# Patient Record
Sex: Female | Born: 2005 | Hispanic: Yes | Marital: Single | Smoking: Never smoker
Health system: Southern US, Community
[De-identification: ages and names within clinical notes are randomized; demographics above are authoritative.]

## PROBLEM LIST (undated history)

## (undated) DIAGNOSIS — Z789 Other specified health status: Secondary | ICD-10-CM

## (undated) DIAGNOSIS — H539 Unspecified visual disturbance: Secondary | ICD-10-CM

## (undated) DIAGNOSIS — F419 Anxiety disorder, unspecified: Secondary | ICD-10-CM

## (undated) HISTORY — DX: Unspecified visual disturbance: H53.9

## (undated) HISTORY — DX: Anxiety disorder, unspecified: F41.9

## (undated) HISTORY — PX: OTHER SURGICAL HISTORY: SHX169

## (undated) HISTORY — PX: WISDOM TOOTH EXTRACTION: SHX21

---
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
## 2024-05-21 ENCOUNTER — Telehealth: Payer: Self-pay | Admitting: *Deleted

## 2024-05-21 NOTE — Telephone Encounter (Signed)
 Called and left a voicemail for patient to call the office to reschedule the appointment for 06/05/24 due to the provider being out of the office.

## 2024-05-28 ENCOUNTER — Ambulatory Visit

## 2024-06-05 ENCOUNTER — Ambulatory Visit: Admitting: Family Medicine

## 2024-06-17 ENCOUNTER — Telehealth: Payer: Self-pay | Admitting: Family Medicine

## 2024-06-17 NOTE — Telephone Encounter (Signed)
 I called the patient and left a message stating the following:   Our office has you scheduled for a new patient appointment on 06/26/24, with an arrival time of 2:20 PM. Please remember to bring your insurance card, photo ID, and a medication list of anything you're currently taking at this time. Should you need to cancel the appointment, we do require a 24-hour notice. If the appointment is canceled the day of or no showed, then we will not be able to reschedule your new patient appointment.  Please call the office back at [phone] to confirm your appointment and to go over any further registration questions we may have. Please ask to speak with [place].   Thank you for your time and we look forward to speaking with you.

## 2024-06-25 ENCOUNTER — Ambulatory Visit

## 2024-06-25 VITALS — BP 104/70 | HR 98 | Temp 97.5°F | Ht 62.0 in | Wt 202.8 lb

## 2024-06-25 DIAGNOSIS — E559 Vitamin D deficiency, unspecified: Secondary | ICD-10-CM | POA: Insufficient documentation

## 2024-06-25 DIAGNOSIS — L219 Seborrheic dermatitis, unspecified: Secondary | ICD-10-CM | POA: Insufficient documentation

## 2024-06-25 DIAGNOSIS — R21 Rash and other nonspecific skin eruption: Secondary | ICD-10-CM | POA: Insufficient documentation

## 2024-06-25 DIAGNOSIS — N920 Excessive and frequent menstruation with regular cycle: Secondary | ICD-10-CM | POA: Insufficient documentation

## 2024-06-25 MED ORDER — NORGESTIMATE-ETH ESTRADIOL 0.18/0.215/0.25 MG-25 MCG PO TABS: 1.0000 | ORAL_TABLET | Freq: Every day | ORAL | 3 refills | Status: AC

## 2024-06-25 MED ORDER — VITAMIN D (ERGOCALCIFEROL) 1.25 MG (50000 UNIT) PO CAPS: 50000.0000 [IU] | ORAL_CAPSULE | ORAL | 0 refills | Status: AC

## 2024-06-25 NOTE — Progress Notes (Unsigned)
 Subjective:  Patient ID: Terri [name], female    DOB: 2006/05/07  Age: 18 y.o. MRN: [id number]  Chief Complaint  Patient presents with   Establish Care    HPI:  Patient is establishing as a new patient.  Discussed the use of AI scribe software for clinical note transcription with the patient, who gave verbal consent to proceed.  Discussed the use of AI scribe software for clinical note transcription with the patient, who gave verbal consent to proceed.  History of Present Illness   Terri [name] is a 18 year old female who presents with a rash on her legs and concerns about menstrual irregularities.  Lower extremity rash - Rash began as a few bumps on the legs approximately five to six months ago - Initially thought to be related to shaving - Rash has spread to both legs and thighs - Occasionally pruritic - Treated at urgent care for presumed infection due to scratching - Currently using adapalene without improvement  Menstrual irregularities - Menstrual cycles previously regular every 25 days since age 50 - Last month, experienced two cycles within a month, with periods occurring every two weeks for about a month and a half - No significant stress or changes in routine - Not on prescribed birth control - Uses an over-the-counter pill called 'Opium '  Hair loss and hirsutism - Hair loss on scalp - Facial hair growth present  Vitamin d  deficiency - Low vitamin D  levels identified earlier this year - Not on prescribed vitamin D  supplementation - Uses over-the-counter vitamin D   Visual disturbances and history of optic neuritis - History of optic neuritis diagnosed at age 26, treated with high-dose steroids - Near-complete recovery with some residual vision effects - No current vision problems aside from residual effects from past optic neuritis             06/25/2024    1:56 PM  Depression screen PHQ 2/9  Decreased Interest 0  Down,  Depressed, Hopeless 0  PHQ - 2 Score 0         06/25/2024    1:56 PM  Fall Risk  Falls in the past year? 0  Was there an injury with Fall? 0  Fall Risk Category Calculator 0  Patient at Risk for Falls Due to No Fall Risks  Fall risk Follow up Falls evaluation completed     Medications Ordered Prior to Encounter[1]. Social History   Socioeconomic History   Marital status: Single    Spouse name: Not on file   Number of children: 0   Years of education: Not on file   Highest education level: Some college, no degree  Occupational History   Not on file  Tobacco Use   Smoking status: Never   Smokeless tobacco: Never  Vaping Use   Vaping status: Never Used  Substance and Sexual Activity   Alcohol use: Never   Drug use: Never   Sexual activity: Yes    Partners: Male  Other Topics Concern   Not on file  Social History Narrative   Not on file   Social Drivers of Health   Tobacco Use: Low Risk (06/25/2024)   Patient History    Smoking Tobacco Use: Never    Smokeless Tobacco Use: Never    Passive Exposure: Not on file  Financial Resource Strain: Low Risk (06/24/2024)   Overall Financial Resource Strain (CARDIA)    Difficulty of Paying Living Expenses: Not very hard  Food Insecurity: No Food Insecurity (  06/24/2024)   Epic    Worried About Programme Researcher, Broadcasting/film/video in the Last Year: Never true    [employer] of Food in the Last Year: Never true  Transportation Needs: No Transportation Needs (06/24/2024)   Epic    Lack of Transportation (Medical): No    Lack of Transportation (Non-Medical): No  Physical Activity: Insufficiently Active (06/24/2024)   Exercise Vital Sign    Days of Exercise per Week: 2 days    Minutes of Exercise per Session: 30 min  Stress: No Stress Concern Present (06/24/2024)   [employer] of Occupational Health - Occupational Stress Questionnaire    Feeling of Stress: Only a little  Social Connections: Moderately Integrated  (06/24/2024)   Social Connection and Isolation Panel    Frequency of Communication with Friends and Family: More than three times a week    Frequency of Social Gatherings with Friends and Family: Once a week    Attends Religious Services: More than 4 times per year    Active Member of Clubs or Organizations: Yes    Attends Banker Meetings: More than 4 times per year    Marital Status: Never married  Depression (PHQ2-9): Low Risk (06/25/2024)   Depression (PHQ2-9)    PHQ-2 Score: 0  Alcohol Screen: Low Risk (06/25/2024)   Alcohol Screen    Last Alcohol Screening Score (AUDIT): 0  Housing: Low Risk (06/24/2024)   Epic    Unable to Pay for Housing in the Last Year: No    Number of Times Moved in the Last Year: 0    Homeless in the Last Year: No  Utilities: Not At Risk (06/25/2024)   Epic    Threatened with loss of utilities: No  Health Literacy: Adequate Health Literacy (06/25/2024)   B1300 Health Literacy    Frequency of need for help with medical instructions: Never   Past Medical History:  Diagnosis Date   Anxiety    Medical history non-contributory    Vision abnormalities    Family History  Problem Relation Age of Onset   Hyperlipidemia Father    Hyperlipidemia Brother     Review of Systems  Constitutional:  Negative for chills, fatigue and fever.  HENT:  Negative for congestion, ear pain, sinus pressure and sore throat.   Respiratory:  Negative for cough and shortness of breath.   Cardiovascular:  Negative for chest pain.  Gastrointestinal:  Negative for abdominal pain, constipation, diarrhea, nausea and vomiting.  Genitourinary:  Negative for dysuria and frequency.  Musculoskeletal:  Negative for arthralgias, back pain and myalgias.  Neurological:  Negative for dizziness and headaches.  Psychiatric/Behavioral:  Negative for dysphoric mood. The patient is not nervous/anxious.      Objective:  BP 104/70   Pulse 98   Temp (!) 97.5  F (36.4 C)   Ht 5' 2 (1.575 m)   Wt 202 lb 12.8 oz (92 kg)   LMP 06/02/2024 (Exact Date)   SpO2 98%   BMI 37.09 kg/m      06/25/2024    1:47 PM 02/23/2015    3:46 PM 07/03/2014   11:37 AM  BP/Weight  Systolic BP 104 90 108  Diastolic BP 70 60 68  Wt. (Lbs) 202.8 114.8 102.8  BMI 37.09 kg/m2 27.42 kg/m2 26.22 kg/m2    Physical Exam Vitals and nursing note reviewed.  Constitutional:      Appearance: She is obese.  HENT:     Head: Normocephalic and atraumatic.  Mouth/Throat:     Mouth: Mucous membranes are moist.  Eyes:     Extraocular Movements: Extraocular movements intact.     Pupils: Pupils are equal, round, and reactive to light.  Cardiovascular:     Rate and Rhythm: Normal rate and regular rhythm.  Pulmonary:     Effort: Pulmonary effort is normal.     Breath sounds: Normal breath sounds.  Musculoskeletal:     Cervical back: Normal range of motion.  Skin:    Comments: Small papules/ bumps noted on the legs.erythematous because she picked at them  Neurological:     Mental Status: She is alert.    {Perform Simple Foot Exam  Perform Detailed exam:1} {Insert foot Exam (Optional):30965}   Lab Results  Component Value Date   WBC 6.8 02/28/2014   HGB 13.1 02/28/2014   HCT 37.2 02/28/2014   PLT 332 02/28/2014   GLUCOSE 87 02/28/2014   ALT 31 02/28/2014   AST 32 02/28/2014   NA 141 02/28/2014   K 4.2 02/28/2014   CL 102 02/28/2014   CREATININE 0.31 (L) 02/28/2014   BUN 4 (L) 02/28/2014   CO2 26 02/28/2014      Assessment & Plan:  There are no diagnoses linked to this encounter.    No orders of the defined types were placed in this encounter.  No orders of the defined types were placed in this encounter.    Assessment and Plan    .   Follow-up: No follow-ups on file.  AVS was given to patient prior to departure.  [facility] [phone]       [1] No current outpatient medications on file prior to  visit.   No current facility-administered medications on file prior to visit.

## 2024-06-25 NOTE — Patient Instructions (Signed)
°  VISIT SUMMARY: During your visit, we discussed the rash on your legs, menstrual irregularities, vitamin D  deficiency, and hair loss. We have updated your treatment plan to address these concerns.  YOUR PLAN: PAPULAR RASH OF THE LOWER EXTREMITIES: You have a chronic rash on your legs that has been spreading and was initially thought to be folliculitis. -Apply triple antibiotic ointment to any infected lesions. -Monitor the rash for any changes or worsening.  FREQUENT MENSTRUATION: You have been experiencing menstrual cycles more frequently than usual, with periods occurring every two weeks. -Monitor your menstrual cycles for the next 1-2 cycles. -Start taking the prescribed combined oral contraceptive pill to help regulate your cycles and potentially address acne. -We may consider an ultrasound of your uterus if your cycles remain irregular.  VITAMIN D  DEFICIENCY: Your vitamin D  levels are low, and previous over-the-counter supplements were not sufficient. -Take the prescribed high-dose vitamin D  supplement once weekly for 12 weeks. -Continue taking your over-the-counter vitamin D  supplement.  SEBORRHEIC DERMATITIS OF SCALP: You have seborrheic dermatitis, which is causing dandruff and potential hair loss. -Continue using Selsun Blue shampoo twice a week. -Consider using a medicated shampoo if the current treatment does not work.                      Contains text generated by Abridge.                                 Contains text generated by Abridge.

## 2024-06-26 ENCOUNTER — Ambulatory Visit: Admitting: Family Medicine

## 2024-06-27 NOTE — Assessment & Plan Note (Signed)
°  Seborrheic dermatitis of scalp Seborrheic dermatitis causing dandruff and potential hair loss. Previous ketoconazole shampoo was ineffective. Current use of Selsun Blue shampoo. - Continue using Selsun Blue shampoo twice weekly. - Consider medicated shampoo if current treatment is ineffective.

## 2024-06-27 NOTE — Assessment & Plan Note (Signed)
 Papular rash of the lower extremities Chronic papular rash on the legs for 5-6 months, initially misdiagnosed as folliculitis. Rash is spreading, with some lesions infected due to scratching. Adapalene was ineffective.   Differential includes folliculitis vs acne, but presentation is atypical for viral, fungal, bacterial, or eczema. - advised to Apply triple antibiotic ointment to infected lesions. - strongly recommended against picking the papules. - since there is no itching or evidence of dry skin, does not appear to be eczema or contact dermatitis, hence I do not see any benefit of using a steroid cream. - Monitor rash for changes or worsening.

## 2024-06-27 NOTE — Assessment & Plan Note (Signed)
 Frequent menstruation Menstrual cycles every 25 days, with recent episodes of two cycles within a month. No significant stress or weight changes. Differential includes hormonal imbalance or polycystic ovary syndrome, but frequent cycles are atypical for PCOS. Current over-the-counter birth control may not be sufficient. - Monitor menstrual cycles for another 1-2 cycles. - Prescribed combined oral contraceptive pill to regulate cycles and potentially address acne. - Will consider ultrasound of uterus if cycles remain irregular.

## 2024-06-27 NOTE — Assessment & Plan Note (Signed)
°  Vitamin D  deficiency Confirmed by blood work in March. No anemia or thyroid dysfunction. Previous over-the-counter supplementation was insufficient. - Prescribed high-dose vitamin D  supplement ERGOCALCIFEROL  50,000 international units  once weekly for 12 weeks. - Continue over-the-counter vitamin D  supplementation after that.

## 2024-07-16 ENCOUNTER — Ambulatory Visit

## 2024-07-16 VITALS — BP 112/80 | HR 68 | Temp 98.2°F | Resp 18 | Ht 62.0 in | Wt 202.5 lb

## 2024-07-16 DIAGNOSIS — R198 Other specified symptoms and signs involving the digestive system and abdomen: Secondary | ICD-10-CM | POA: Insufficient documentation

## 2024-07-16 DIAGNOSIS — R110 Nausea: Secondary | ICD-10-CM | POA: Insufficient documentation

## 2024-07-16 NOTE — Assessment & Plan Note (Signed)
 Recommended bland diet, prilosec. No vomiting which is reassuring. Has good appetite and no weight changes. Will monitor

## 2024-07-16 NOTE — Progress Notes (Signed)
 "  Subjective:  Patient ID: Terri [name], female    DOB: 2006-01-16  Age: 19 y.o. MRN: [id number]  Chief Complaint  Patient presents with   GI Problem    Diarrhea, constipation, nausea    HPI: Discussed the use of AI scribe software for clinical note transcription with the patient, who gave verbal consent to proceed.  History of Present Illness    Terri [name] is a 19 year old female who presents with abdominal pain, nausea, and alternating diarrhea and constipation.  Abdominal pain - Duration of approximately one and a half weeks - Pain primarily located in the central abdomen, occasionally radiating upwards - Pain intensity rated as 6 out of 10 at its worst - Most severe episodes occurred last night and last Friday - Pain not associated with food intake  Gastrointestinal symptoms - Nausea present without vomiting - Alternating diarrhea and constipation - Diarrhea frequency ranges from three to seven times daily, with urgency - Constipation characterized by inability to pass stool - Bowel movements are inconsistent, sometimes resulting in mushy stools - No blood in stool - No recent changes in diet - No recent travel - No upper respiratory symptoms - No weight loss - Persistent bloating for about a week  Medication use - Recently started birth control pills for menstrual irregularities - Taking vitamin D  supplementation - No use of over-the-counter medications for gastrointestinal symptoms - Used Tylenol for headache  Diet and hydration - Maintains regular diet, including soup and pizza - No specific food triggers identified - Maintains adequate fluid intake  Exposure history - Family had influenza three to four weeks ago - Did not contract influenza despite two negative tests            06/25/2024    1:56 PM  Depression screen PHQ 2/9  Decreased Interest 0  Down, Depressed, Hopeless 0  PHQ - 2 Score 0        06/25/2024    1:56 PM   Fall Risk   Falls in the past year? 0  Number falls in past yr: 0  Injury with Fall? 0  Risk for fall due to : No Fall Risks  Follow up Falls evaluation completed    Patient Care Team: Myonna Chisom, MD as PCP - General (Family Medicine) Neysa Fallow, MD as Consulting Physician (Ophthalmology)   Review of Systems  Constitutional:  Negative for chills, diaphoresis, fatigue and fever.  HENT:  Negative for congestion, ear pain and sinus pain.   Eyes: Negative.   Respiratory:  Negative for cough and shortness of breath.   Cardiovascular:  Negative for chest pain.  Gastrointestinal:  Positive for constipation, diarrhea and nausea. Negative for abdominal pain and vomiting.  Endocrine: Negative.   Genitourinary:  Negative for dysuria, frequency and urgency.  Musculoskeletal:  Negative for arthralgias.  Allergic/Immunologic: Negative.   Neurological:  Negative for weakness and headaches.  Hematological: Negative.   Psychiatric/Behavioral:  Negative for dysphoric mood. The patient is not nervous/anxious.     Medications Ordered Prior to Encounter[1] Past Medical History:  Diagnosis Date   Anxiety    Medical history non-contributory    Vision abnormalities    Past Surgical History:  Procedure Laterality Date   OTHER SURGICAL HISTORY Right July 2000   Stitches to repair injury    RADIOLOGY WITH ANESTHESIA N/A 02/27/2014   Procedure: RADIOLOGY WITH ANESTHESIA;  Surgeon: Medication Radiologist, MD;  Location: [facility] OR;  Service: Radiology;  Laterality: N/A;   WISDOM TOOTH  EXTRACTION Bilateral     Family History  Problem Relation Age of Onset   Hyperlipidemia Father    Hyperlipidemia Brother    Social History   Socioeconomic History   Marital status: Single    Spouse name: Not on file   Number of children: 0   Years of education: Not on file   Highest education level: Some college, no degree  Occupational History   Not on file  Tobacco Use   Smoking status: Never    Smokeless tobacco: Never  Vaping Use   Vaping status: Never Used  Substance and Sexual Activity   Alcohol use: Never   Drug use: Never   Sexual activity: Yes    Partners: Male  Other Topics Concern   Not on file  Social History Narrative   Not on file   Social Drivers of Health   Tobacco Use: Low Risk (07/16/2024)   Patient History    Smoking Tobacco Use: Never    Smokeless Tobacco Use: Never    Passive Exposure: Not on file  Financial Resource Strain: Low Risk (06/24/2024)   Overall Financial Resource Strain (CARDIA)    Difficulty of Paying Living Expenses: Not very hard  Food Insecurity: No Food Insecurity (06/24/2024)   Epic    Worried About Radiation Protection Practitioner of Food in the Last Year: Never true    Ran Out of Food in the Last Year: Never true  Transportation Needs: No Transportation Needs (06/24/2024)   Epic    Lack of Transportation (Medical): No    Lack of Transportation (Non-Medical): No  Physical Activity: Insufficiently Active (06/24/2024)   Exercise Vital Sign    Days of Exercise per Week: 2 days    Minutes of Exercise per Session: 30 min  Stress: No Stress Concern Present (06/24/2024)   [employer] of Occupational Health - Occupational Stress Questionnaire    Feeling of Stress: Only a little  Social Connections: Moderately Integrated (06/24/2024)   Social Connection and Isolation Panel    Frequency of Communication with Friends and Family: More than three times a week    Frequency of Social Gatherings with Friends and Family: Once a week    Attends Religious Services: More than 4 times per year    Active Member of Clubs or Organizations: Yes    Attends Banker Meetings: More than 4 times per year    Marital Status: Never married  Depression (PHQ2-9): Low Risk (06/25/2024)   Depression (PHQ2-9)    PHQ-2 Score: 0  Alcohol Screen: Low Risk (06/25/2024)   Alcohol Screen    Last Alcohol Screening Score (AUDIT): 0  Housing: Low Risk (06/24/2024)    Epic    Unable to Pay for Housing in the Last Year: No    Number of Times Moved in the Last Year: 0    Homeless in the Last Year: No  Utilities: Not At Risk (06/25/2024)   Epic    Threatened with loss of utilities: No  Health Literacy: Adequate Health Literacy (06/25/2024)   B1300 Health Literacy    Frequency of need for help with medical instructions: Never    Objective:  BP 112/80   Pulse 68   Temp 98.2 F (36.8 C) (Temporal)   Resp 18   Ht 5' 2 (1.575 m)   Wt 202 lb 8 oz (91.9 kg)   LMP 06/02/2024 (Exact Date)   SpO2 100%   BMI 37.04 kg/m      07/16/2024    2:12 PM 06/25/2024  1:47 PM 02/23/2015    3:46 PM  BP/Weight  Systolic BP 112 104 90  Diastolic BP 80 70 60  Wt. (Lbs) 202.5 202.8 114.8  BMI 37.04 kg/m2 37.09 kg/m2 27.42 kg/m2    Physical Exam Vitals and nursing note reviewed.  HENT:     Head: Normocephalic and atraumatic.     Mouth/Throat:     Mouth: Mucous membranes are dry.  Cardiovascular:     Rate and Rhythm: Normal rate and regular rhythm.  Pulmonary:     Effort: Pulmonary effort is normal.     Breath sounds: Normal breath sounds.  Abdominal:     General: Bowel sounds are normal.     Palpations: Abdomen is soft.     Tenderness: There is abdominal tenderness (mild epigastric and umbilical tenderness). There is no right CVA tenderness, left CVA tenderness, guarding or rebound.     Hernia: No hernia is present.  Musculoskeletal:        General: Normal range of motion.     Cervical back: Normal range of motion.         Lab Results  Component Value Date   WBC 6.8 02/28/2014   HGB 13.1 02/28/2014   HCT 37.2 02/28/2014   PLT 332 02/28/2014   GLUCOSE 87 02/28/2014   ALT 31 02/28/2014   AST 32 02/28/2014   NA 141 02/28/2014   K 4.2 02/28/2014   CL 102 02/28/2014   CREATININE 0.31 (L) 02/28/2014   BUN 4 (L) 02/28/2014   CO2 26 02/28/2014    Results for orders placed or performed during the hospital encounter of 02/27/14  CSF culture    Collection Time: 02/28/14  4:45 AM   Specimen: Cerebrospinal Fluid  Result Value Ref Range   Specimen Description CSF    Special Requests Normal    Gram Stain      CYTOSPN SLIDE WBC PRESENT, PREDOMINANTLY MONONUCLEAR NO ORGANISMS SEEN Performed at [facility] Performed at [facility]   Culture      NO GROWTH 3 DAYS Performed at [facility]   Report Status 03/03/2014 FINAL   Gram stain - STAT with CSF culture   Collection Time: 02/28/14  4:45 AM   Specimen: Cerebrospinal Fluid  Result Value Ref Range   Specimen Description CSF    Special Requests Normal    Gram Stain      CYTOSPIN WBC PRESENT, PREDOMINANTLY MONONUCLEAR NO ORGANISMS SEEN   Report Status 02/28/2014 FINAL   Glucose, CSF   Collection Time: 02/28/14  4:45 AM  Result Value Ref Range   Glucose, CSF 55 43 - 76 mg/dL  Protein, CSF   Collection Time: 02/28/14  4:45 AM  Result Value Ref Range   Total  Protein, CSF 16 15 - 45 mg/dL  CSF cell count with differential   Collection Time: 02/28/14  4:45 AM  Result Value Ref Range   Tube # 3    Color, CSF COLORLESS COLORLESS   Appearance, CSF CLEAR CLEAR   Supernatant NOT INDICATED    RBC Count, CSF 4 (H) 0 /cu mm   WBC, CSF 4 0 - 10 /cu mm   Lymphs, CSF RARE 40 - 80 %   Monocyte-Macrophage-Spinal Fluid RARE 15 - 45 %   Other Cells, CSF TOO FEW TO COUNT, SMEAR AVAILABLE FOR REVIEW   CBC with Differential   Collection Time: 02/28/14  8:00 AM  Result Value Ref Range   WBC 6.8 4.5 - 13.5 K/uL  RBC 4.76 3.80 - 5.20 MIL/uL   Hemoglobin 13.1 11.0 - 14.6 g/dL   HCT 62.7 66.9 - 55.9 %   MCV 78.2 77.0 - 95.0 fL   MCH 27.5 25.0 - 33.0 pg   MCHC 35.2 31.0 - 37.0 g/dL   RDW 87.6 88.6 - 84.4 %   Platelets 332 150 - 400 K/uL   Neutrophils Relative % 46 33 - 67 %   Neutro Abs 3.2 1.5 - 8.0 K/uL   Lymphocytes Relative 44 31 - 63 %   Lymphs Abs 3.0 1.5 - 7.5 K/uL   Monocytes Relative 7 3 - 11 %   Monocytes Absolute 0.5 0.2 - 1.2 K/uL    Eosinophils Relative 2 0 - 5 %   Eosinophils Absolute 0.1 0.0 - 1.2 K/uL   Basophils Relative 1 0 - 1 %   Basophils Absolute 0.0 0.0 - 0.1 K/uL  Comprehensive metabolic panel   Collection Time: 02/28/14 12:40 PM  Result Value Ref Range   Sodium 141 137 - 147 mEq/L   Potassium 4.2 3.7 - 5.3 mEq/L   Chloride 102 96 - 112 mEq/L   CO2 26 19 - 32 mEq/L   Glucose, Bld 87 70 - 99 mg/dL   BUN 4 (L) 6 - 23 mg/dL   Creatinine, Ser 9.68 (L) 0.47 - 1.00 mg/dL   Calcium 9.9 8.4 - 89.4 mg/dL   Total Protein 7.3 6.0 - 8.3 g/dL   Albumin 3.9 3.5 - 5.2 g/dL   AST 32 0 - 37 U/L   ALT 31 0 - 35 U/L   Alkaline Phosphatase 158 69 - 325 U/L   Total Bilirubin <0.2 (L) 0.3 - 1.2 mg/dL   GFR calc non Af Amer NOT CALCULATED >90 mL/min   GFR calc Af Amer NOT CALCULATED >90 mL/min   Anion gap 13 5 - 15  Neuromyelitis optica autoab, IgG   Collection Time: 02/28/14 12:40 PM  Result Value Ref Range   NMO-IgG NEGATIVE NEGATIVE  Quantiferon tb gold assay   Collection Time: 02/28/14 12:40 PM  Result Value Ref Range   Interferon Gamma Release Assay NEGATIVE NEGATIVE   TB Ag value 0.06 IU/mL   Quantiferon Nil Value 0.07 IU/mL   Mitogen value >10.00 IU/mL   TB Antigen Minus Nil Value 0.00 IU/mL  .  Assessment & Plan:   Assessment & Plan Nausea Recommended bland diet, prilosec. No vomiting which is reassuring. Has good appetite and no weight changes. Will monitor    Alternating constipation and diarrhea Symptoms of alternating diarrhea and constipation, nausea, and abdominal pain for approximately one and a half weeks. No recent travel, dietary changes, or weight loss. Negative flu tests.   Symptoms may be triggered by a recent viral infection or gastritis.   Differential includes gastritis due to possible stomach inflammation.  - Start omeprazole 20 mg twice daily for two weeks to address potential gastritis and associated symptoms. - Start a probiotic such as Florajen daily for two weeks to  restore gut flora. - Maintain a bland diet to minimize gastrointestinal irritation. - Increase water intake to maintain hydration. - Monitor symptoms and if no improvement or worsening occurs, will consider further evaluation with blood work.      Assessment and Plan          Body mass index is 37.04 kg/m.        No orders of the defined types were placed in this encounter.   No orders of the defined types were  placed in this encounter.      Follow-up: No follow-ups on file.  An After Visit Summary was printed and given to the patient.  Catalina Salasar, MD [facility] [phone]     [1]  Current Outpatient Medications on File Prior to Visit  Medication Sig Dispense Refill   Norgestimate -Eth Estradiol  (ORTHO TRI-CYCLEN LO) 0.18/0.215/0.25 MG-25 MCG TABS Take 1 tablet by mouth daily. 84 tablet 3   Vitamin D , Ergocalciferol , (DRISDOL ) 1.25 MG (50000 UNIT) CAPS capsule Take 1 capsule (50,000 Units total) by mouth every 7 (seven) days. 12 capsule 0   No current facility-administered medications on file prior to visit.   "

## 2024-07-16 NOTE — Assessment & Plan Note (Signed)
 Symptoms of alternating diarrhea and constipation, nausea, and abdominal pain for approximately one and a half weeks. No recent travel, dietary changes, or weight loss. Negative flu tests.   Symptoms may be triggered by a recent viral infection or gastritis.   Differential includes gastritis due to possible stomach inflammation.  - Start omeprazole 20 mg twice daily for two weeks to address potential gastritis and associated symptoms. - Start a probiotic such as Florajen daily for two weeks to restore gut flora. - Maintain a bland diet to minimize gastrointestinal irritation. - Increase water intake to maintain hydration. - Monitor symptoms and if no improvement or worsening occurs, will consider further evaluation with blood work.

## 2024-07-18 ENCOUNTER — Ambulatory Visit

## 2024-07-23 ENCOUNTER — Ambulatory Visit: Admitting: Family Medicine

## 2024-08-14 ENCOUNTER — Ambulatory Visit: Admitting: Physician Assistant

## 2024-08-22 ENCOUNTER — Ambulatory Visit

## 2024-08-22 ENCOUNTER — Encounter: Admitting: Obstetrics and Gynecology

## 2024-10-22 ENCOUNTER — Ambulatory Visit
